# Patient Record
Sex: Female | Born: 1945 | Race: White | Hispanic: No | Marital: Married | Smoking: Current every day smoker
Health system: Southern US, Community
[De-identification: ages and names within clinical notes are randomized; demographics above are authoritative.]

## PROBLEM LIST (undated history)

## (undated) DIAGNOSIS — F419 Anxiety disorder, unspecified: Secondary | ICD-10-CM

## (undated) DIAGNOSIS — I10 Essential (primary) hypertension: Secondary | ICD-10-CM

## (undated) DIAGNOSIS — N200 Calculus of kidney: Secondary | ICD-10-CM

---

## 2000-01-09 ENCOUNTER — Emergency Department (HOSPITAL_COMMUNITY): Admission: EM | Admit: 2000-01-09 | Discharge: 2000-01-09 | Payer: Self-pay | Admitting: Emergency Medicine

## 2000-01-09 ENCOUNTER — Encounter: Payer: Self-pay | Admitting: Emergency Medicine

## 2000-01-25 ENCOUNTER — Ambulatory Visit (HOSPITAL_COMMUNITY): Admission: RE | Admit: 2000-01-25 | Discharge: 2000-01-25 | Payer: Self-pay | Admitting: Urology

## 2000-01-25 ENCOUNTER — Encounter: Payer: Self-pay | Admitting: Urology

## 2012-06-30 ENCOUNTER — Emergency Department (HOSPITAL_COMMUNITY): Payer: Medicare Other

## 2012-06-30 ENCOUNTER — Emergency Department (HOSPITAL_COMMUNITY)
Admission: EM | Admit: 2012-06-30 | Discharge: 2012-06-30 | Disposition: A | Discharge status: Home / Self Care | Payer: Medicare Other | Attending: Emergency Medicine | Admitting: Emergency Medicine

## 2012-06-30 ENCOUNTER — Encounter (HOSPITAL_COMMUNITY): Payer: Self-pay | Admitting: Emergency Medicine

## 2012-06-30 DIAGNOSIS — N132 Hydronephrosis with renal and ureteral calculous obstruction: Secondary | ICD-10-CM

## 2012-06-30 DIAGNOSIS — Z87442 Personal history of urinary calculi: Secondary | ICD-10-CM | POA: Insufficient documentation

## 2012-06-30 DIAGNOSIS — N1330 Unspecified hydronephrosis: Secondary | ICD-10-CM | POA: Insufficient documentation

## 2012-06-30 DIAGNOSIS — N201 Calculus of ureter: Secondary | ICD-10-CM | POA: Insufficient documentation

## 2012-06-30 DIAGNOSIS — Z880 Allergy status to penicillin: Secondary | ICD-10-CM | POA: Insufficient documentation

## 2012-06-30 DIAGNOSIS — F17200 Nicotine dependence, unspecified, uncomplicated: Secondary | ICD-10-CM | POA: Insufficient documentation

## 2012-06-30 HISTORY — DX: Calculus of kidney: N20.0

## 2012-06-30 LAB — URINALYSIS, ROUTINE W REFLEX MICROSCOPIC
Bilirubin Urine: NEGATIVE
Glucose, UA: NEGATIVE mg/dL
Ketones, ur: 15 mg/dL — AB
Leukocytes, UA: NEGATIVE
Nitrite: NEGATIVE
Protein, ur: 100 mg/dL — AB
Specific Gravity, Urine: 1.012 (ref 1.005–1.030)
Urobilinogen, UA: 0.2 mg/dL (ref 0.0–1.0)
pH: 7 (ref 5.0–8.0)

## 2012-06-30 LAB — POCT I-STAT, CHEM 8
Calcium, Ion: 1.1 mmol/L — ABNORMAL LOW (ref 1.13–1.30)
Chloride: 103 mEq/L (ref 96–112)
Glucose, Bld: 136 mg/dL — ABNORMAL HIGH (ref 70–99)
HCT: 49 % — ABNORMAL HIGH (ref 36.0–46.0)

## 2012-06-30 MED ORDER — ONDANSETRON HCL 4 MG/2ML IJ SOLN
4.0000 mg | Freq: Once | INTRAMUSCULAR | Status: AC
  Administered 2012-06-30: 4 mg via INTRAVENOUS
  Filled 2012-06-30: qty 2

## 2012-06-30 MED ORDER — CEPHALEXIN 250 MG PO CAPS: 250.0000 mg | ORAL_CAPSULE | Freq: Four times a day (QID) | ORAL | Status: AC

## 2012-06-30 MED ORDER — HYDROMORPHONE HCL PF 1 MG/ML IJ SOLN
1.0000 mg | Freq: Once | INTRAMUSCULAR | Status: AC
  Administered 2012-06-30: 1 mg via INTRAVENOUS
  Filled 2012-06-30: qty 1

## 2012-06-30 MED ORDER — SODIUM CHLORIDE 0.9 % IV BOLUS (SEPSIS)
500.0000 mL | Freq: Once | INTRAVENOUS | Status: AC
  Administered 2012-06-30: 500 mL via INTRAVENOUS

## 2012-06-30 MED ORDER — CEPHALEXIN 250 MG PO CAPS
500.0000 mg | ORAL_CAPSULE | Freq: Once | ORAL | Status: AC
  Administered 2012-06-30: 500 mg via ORAL
  Filled 2012-06-30: qty 2

## 2012-06-30 MED ORDER — ONDANSETRON 8 MG PO TBDP: 8.0000 mg | ORAL_TABLET | Freq: Three times a day (TID) | ORAL | Status: AC | PRN

## 2012-06-30 MED ORDER — OXYCODONE-ACETAMINOPHEN 5-325 MG PO TABS: 1.0000 | ORAL_TABLET | ORAL | Status: AC | PRN

## 2012-06-30 MED ORDER — LORAZEPAM 1 MG PO TABS
0.5000 mg | ORAL_TABLET | Freq: Once | ORAL | Status: AC
  Administered 2012-06-30: 0.5 mg via ORAL
  Filled 2012-06-30: qty 1

## 2012-06-30 NOTE — ED Notes (Signed)
Pt c/o left sided flank pain starting today; pt sts hx of kidney stone and feels same; pt sts took 4mg  PO dilaudid

## 2012-06-30 NOTE — ED Notes (Signed)
Pt discharged.Vital signs stable and GCS 15 

## 2012-06-30 NOTE — ED Provider Notes (Signed)
History     CSN: [id number]  Arrival date & time 06/30/12  1148   First MD Initiated Contact with Patient 06/30/12 1159      Chief Complaint  Patient presents with  . Flank Pain    (Consider location/radiation/quality/duration/timing/severity/associated sxs/prior treatment) HPI  67 year old female with left flank pain similar to previous kidney stone. Patient began having pain approximately 4 hours prior to my evaluation. It is in the left flank area it area radiating to the left groin. She has not noted hematuria. She had one prior kidney stone in 2002 and did require intervention at that time. She has had nausea but has not vomited. Denies any trauma, urinary frequency, fever, chills, weakness, or lightheadedness.  Past Medical History  Diagnosis Date  . Kidney stone     History reviewed. No pertinent past surgical history.  History reviewed. No pertinent family history.  History  Substance Use Topics  . Smoking status: Current Every Day Smoker  . Smokeless tobacco: Not on file  . Alcohol Use: No    OB History   Grav Para Term Preterm Abortions TAB SAB Ect Mult Living                  Review of Systems  All other systems reviewed and are negative.    Allergies  Codeine; Penicillins; Sulfa antibiotics; and Transderm-scop  Home Medications  No current outpatient prescriptions on file.  BP 155/73  Pulse 74  Temp(Src) 97.9 F (36.6 C)  Resp 16  SpO2 92%  Physical Exam  Nursing note and vitals reviewed. Constitutional: She is oriented to person, place, and time. She appears well-developed and well-nourished.  HENT:  Head: Normocephalic and atraumatic.  Right Ear: External ear normal.  Left Ear: External ear normal.  Nose: Nose normal.  Mouth/Throat: Oropharynx is clear and moist.  Eyes: Conjunctivae and EOM are normal. Pupils are equal, round, and reactive to light.  Neck: Normal range of motion. Neck supple.  Cardiovascular: Normal rate, regular  rhythm, normal heart sounds and intact distal pulses.   Pulmonary/Chest: Effort normal and breath sounds normal.  Abdominal: Soft. Bowel sounds are normal.  Musculoskeletal: Normal range of motion.  Neurological: She is alert and oriented to person, place, and time. She has normal reflexes.  Skin: Skin is warm and dry.  Psychiatric: She has a normal mood and affect. Her behavior is normal. Thought content normal.    ED Course  Procedures (including critical care time)  Labs Reviewed  URINALYSIS, ROUTINE W REFLEX MICROSCOPIC - Abnormal; Notable for the following:    Hgb urine dipstick MODERATE (*)    Ketones, ur 15 (*)    Protein, ur 100 (*)    All other components within normal limits  POCT I-STAT, CHEM 8 - Abnormal; Notable for the following:    Creatinine, Ser 1.30 (*)    Glucose, Bld 136 (*)    Calcium, Ion 1.10 (*)    Hemoglobin 16.7 (*)    HCT 49.0 (*)    All other components within normal limits  URINE MICROSCOPIC-ADD ON   Ct Abdomen Pelvis Wo Contrast  06/30/2012   *RADIOLOGY REPORT*  Clinical Data: Left flank pain.  History of renal calculi.  CT ABDOMEN AND PELVIS WITHOUT CONTRAST  Technique:  Multidetector CT imaging of the abdomen and pelvis was performed following the standard protocol without intravenous contrast.  Comparison: None.  Findings: There is scarring at the lung bases.  There is a calcification in the region of the  right diaphragm.  No active cardiopulmonary process is evident.  The liver shows mild fatty change.  No calcified gallstones.  The spleen is normal.  The pancreas is normal.  The adrenal glands are normal.  The aorta and its branch vessels show atherosclerotic disease but no aneurysm.  The right kidney contains a 3 mm nonobstructing stone in its midportion. There is a 9 mm cyst in the lateral midportion.  The left kidney contains a 3 mm nonobstructing stone in the upper pole and shows a 7 mm stone at the UPJ which is causing moderate left hydronephrosis.   No stones seen distal to that.  Bladder, uterus and adnexal regions are unremarkable.  Mild degenerative changes in the lower lumbar spine without acute bony finding.  IMPRESSION: 7 mm stone at the left UPJ with moderate left hydronephrosis.  3 mm nonobstructing stone in the upper pole of the left kidney and 3 mm nonobstructing stone in the midportion of the right kidney.   Original Report Authenticated By: Paulina Fusi, M.D.     No diagnosis found.    Patient pain free here after 1 mg of Dilaudid. CT results reviewed as above and 7 mm stone noted at left UPJ with moderate left hydronephrosis. Plan consult urology for close outpatient followup.        Hilario Quarry, MD 06/30/12 [phone]

## 2013-11-06 IMAGING — CT CT ABD-PELV W/O CM
2 of 4 series · 14 of 32 positions shown, 19 images · non-contrast
Comparison: None.

CLINICAL DATA: Left flank pain.  History of renal calculi.

CT ABDOMEN AND PELVIS WITHOUT CONTRAST
TECHNIQUE: Multidetector CT imaging of the abdomen and pelvis was
performed following the standard protocol without intravenous
contrast.

[Series 2: renal stone · axial · 0.66mm/px · z∈[-350,-80]mm · 6 of 76 slices shown, 11 images]
[im 11/76  soft-tissue]
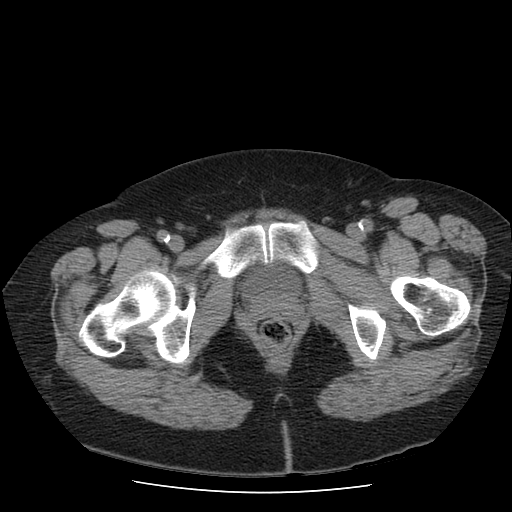
[im 11/76  bone]
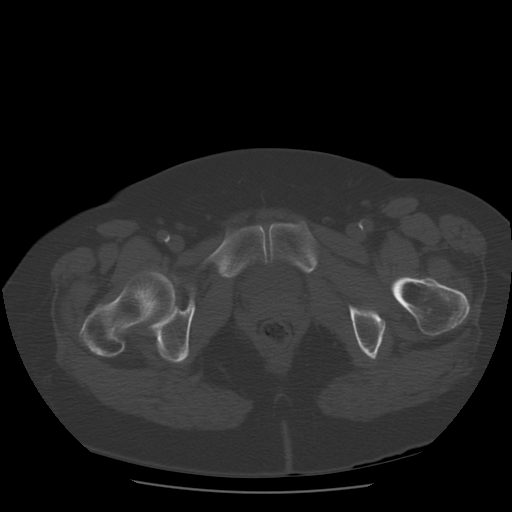
[im 22/76  soft-tissue]
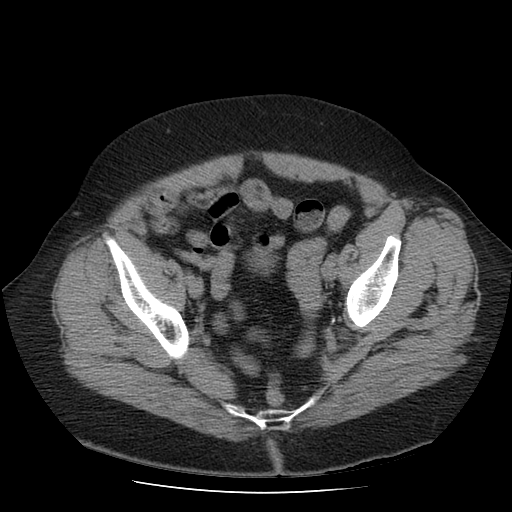
[im 33/76  soft-tissue]
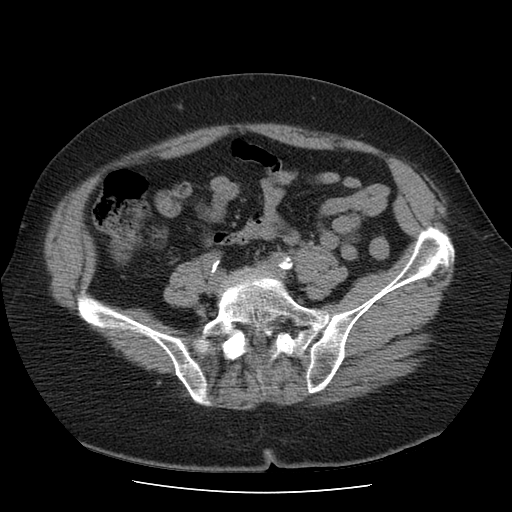
[im 33/76  lung]
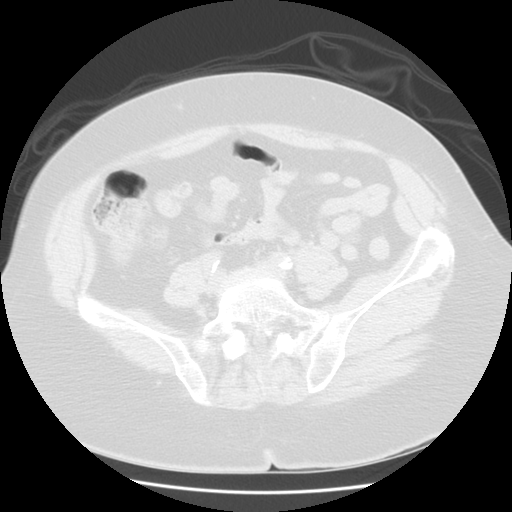
[im 43/76  soft-tissue]
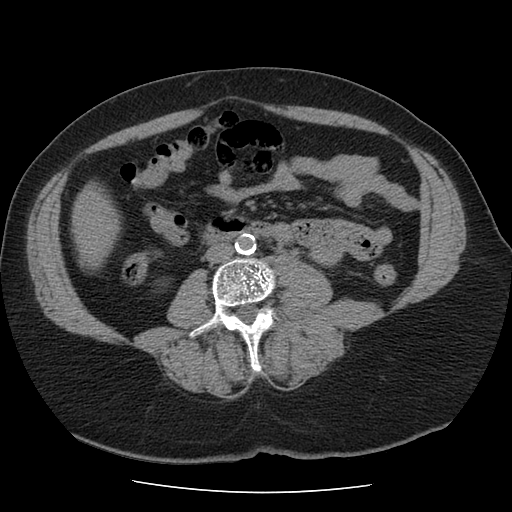
[im 43/76  lung]
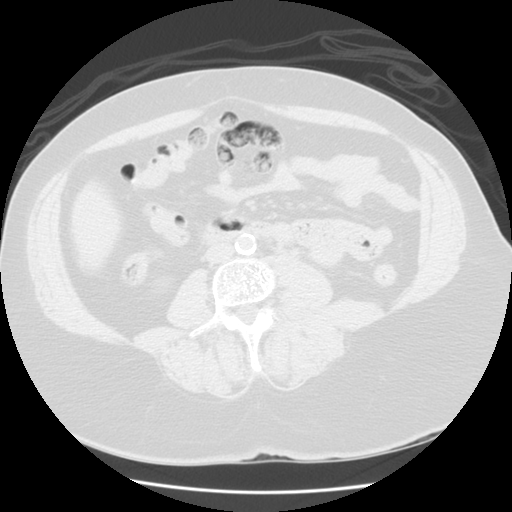
[im 54/76  soft-tissue]
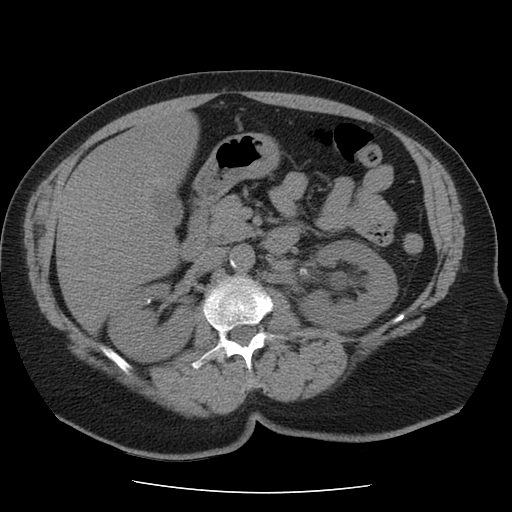
[im 54/76  lung]
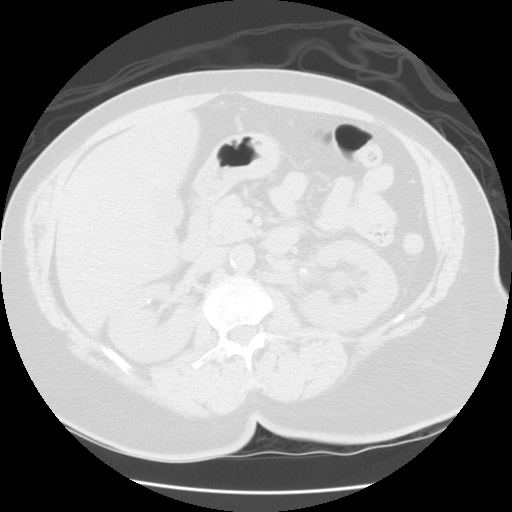
[im 65/76  soft-tissue]
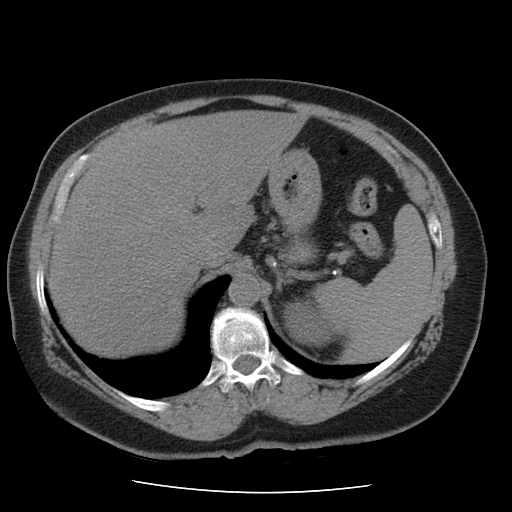
[im 65/76  lung]
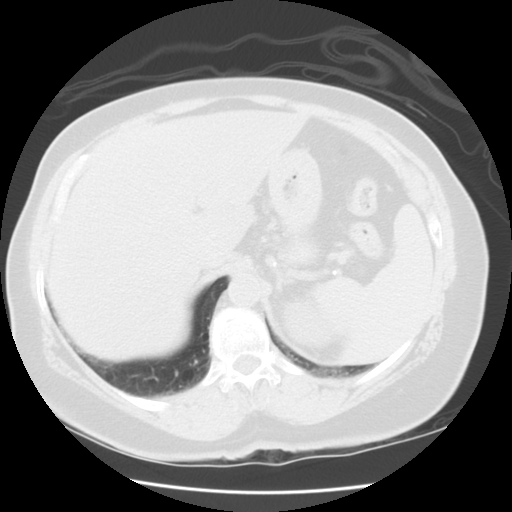

[Series 401: sag · sagittal · 0.75mm/px · 8 of 108 slices shown]
[im 10/108  soft-tissue]
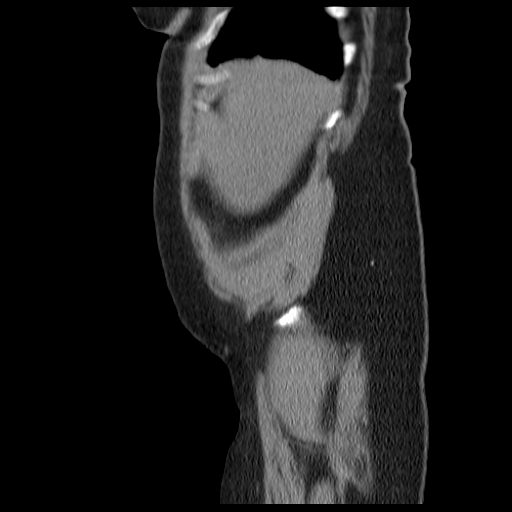
[im 20/108  soft-tissue]
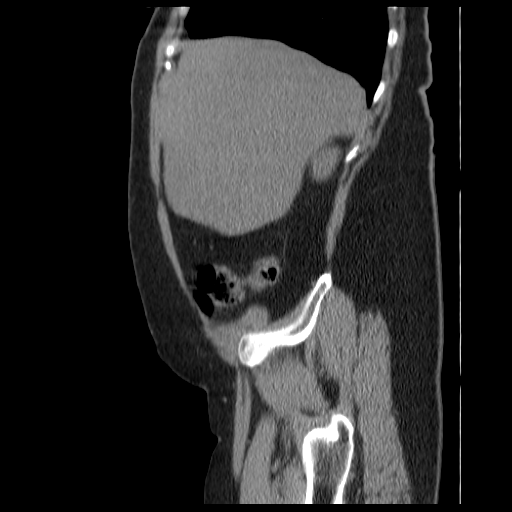
[im 39/108  soft-tissue]
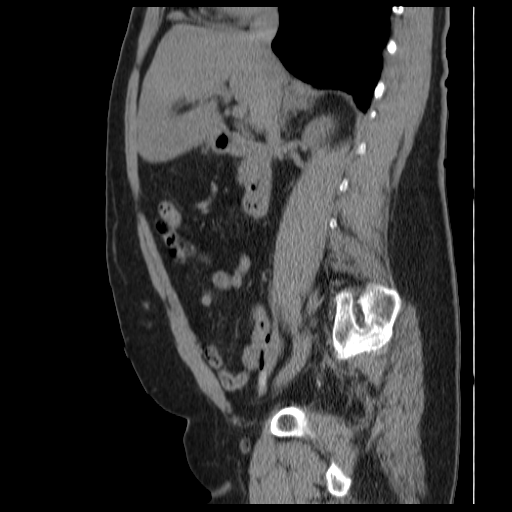
[im 49/108  soft-tissue]
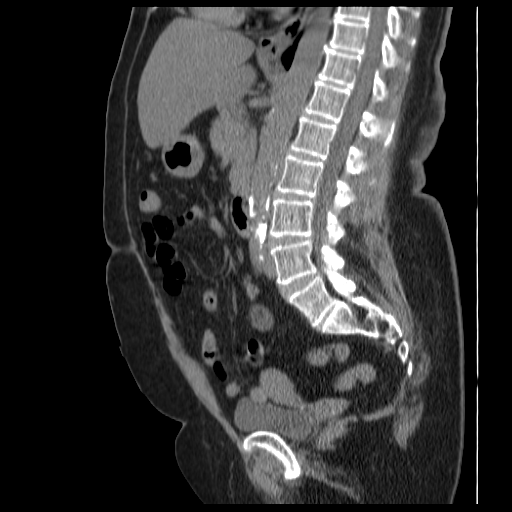
[im 59/108  soft-tissue]
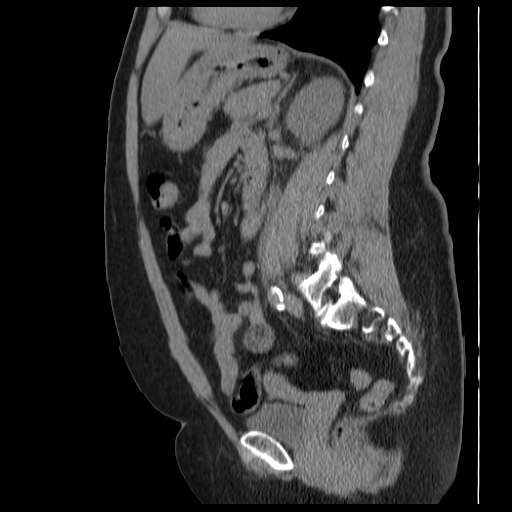
[im 69/108  soft-tissue]
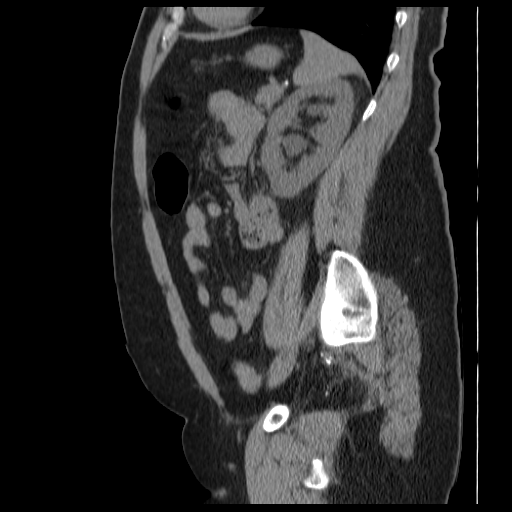
[im 88/108  soft-tissue]
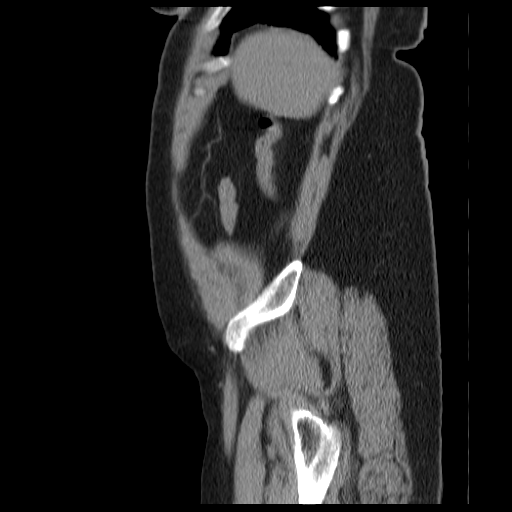
[im 98/108  soft-tissue]
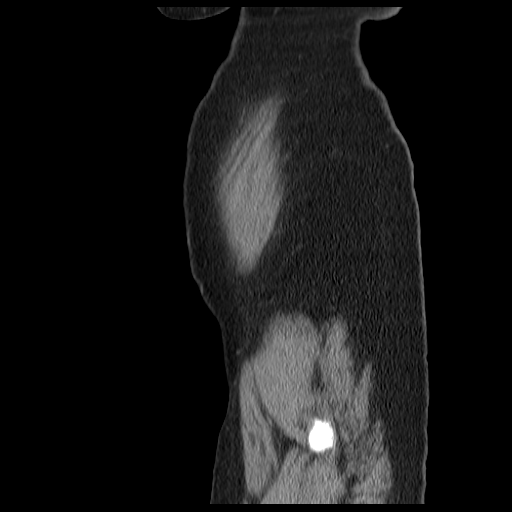

[14 of 32 positions shown; findings below may reference images not displayed]

FINDINGS: There is scarring at the lung bases.  There is a
calcification in the region of the right diaphragm.  No active
cardiopulmonary process is evident.  The liver shows mild fatty
change.  No calcified gallstones.  The spleen is normal.  The
pancreas is normal.  The adrenal glands are normal.  The aorta and
its branch vessels show atherosclerotic disease but no aneurysm.

The right kidney contains a 3 mm nonobstructing stone in its
midportion. There is a 9 mm cyst in the lateral midportion.  The
left kidney contains a 3 mm nonobstructing stone in the upper pole
and shows a 7 mm stone at the UPJ which is causing moderate left
hydronephrosis.  No stones seen distal to that.

Bladder, uterus and adnexal regions are unremarkable.

Mild degenerative changes in the lower lumbar spine without acute
bony finding.
IMPRESSION: 7 mm stone at the left UPJ with moderate left hydronephrosis.

3 mm nonobstructing stone in the upper pole of the left kidney and
3 mm nonobstructing stone in the midportion of the right kidney.

## 2018-07-18 ENCOUNTER — Other Ambulatory Visit: Payer: Self-pay

## 2018-07-18 ENCOUNTER — Encounter (HOSPITAL_COMMUNITY): Payer: Self-pay | Admitting: Emergency Medicine

## 2018-07-18 ENCOUNTER — Emergency Department (HOSPITAL_COMMUNITY): Payer: Medicare Other

## 2018-07-18 ENCOUNTER — Emergency Department (HOSPITAL_COMMUNITY)
Admission: EM | Admit: 2018-07-18 | Discharge: 2018-07-18 | Disposition: A | Discharge status: Home / Self Care | Payer: Medicare Other | Attending: Emergency Medicine | Admitting: Emergency Medicine

## 2018-07-18 DIAGNOSIS — F17210 Nicotine dependence, cigarettes, uncomplicated: Secondary | ICD-10-CM | POA: Diagnosis not present

## 2018-07-18 DIAGNOSIS — K5903 Drug induced constipation: Secondary | ICD-10-CM

## 2018-07-18 DIAGNOSIS — K5641 Fecal impaction: Secondary | ICD-10-CM

## 2018-07-18 DIAGNOSIS — I10 Essential (primary) hypertension: Secondary | ICD-10-CM | POA: Diagnosis not present

## 2018-07-18 DIAGNOSIS — K5900 Constipation, unspecified: Secondary | ICD-10-CM | POA: Diagnosis present

## 2018-07-18 HISTORY — DX: Essential (primary) hypertension: I10

## 2018-07-18 HISTORY — DX: Anxiety disorder, unspecified: F41.9

## 2018-07-18 LAB — CBC WITH DIFFERENTIAL/PLATELET
Abs Immature Granulocytes: 0.04 10*3/uL (ref 0.00–0.07)
Basophils Absolute: 0 10*3/uL (ref 0.0–0.1)
Basophils Relative: 0 %
Eosinophils Absolute: 0 10*3/uL (ref 0.0–0.5)
Eosinophils Relative: 0 %
HCT: 49.3 % — ABNORMAL HIGH (ref 36.0–46.0)
Hemoglobin: 17.3 g/dL — ABNORMAL HIGH (ref 12.0–15.0)
Immature Granulocytes: 0 %
Lymphocytes Relative: 9 %
Lymphs Abs: 1 10*3/uL (ref 0.7–4.0)
MCH: 35.5 pg — ABNORMAL HIGH (ref 26.0–34.0)
MCHC: 35.1 g/dL (ref 30.0–36.0)
MCV: 101.2 fL — ABNORMAL HIGH (ref 80.0–100.0)
Monocytes Absolute: 0.4 10*3/uL (ref 0.1–1.0)
Monocytes Relative: 4 %
Neutro Abs: 9.5 10*3/uL — ABNORMAL HIGH (ref 1.7–7.7)
Neutrophils Relative %: 87 %
Platelets: 283 10*3/uL (ref 150–400)
RBC: 4.87 MIL/uL (ref 3.87–5.11)
RDW: 11.1 % — ABNORMAL LOW (ref 11.5–15.5)
WBC: 10.9 10*3/uL — ABNORMAL HIGH (ref 4.0–10.5)
nRBC: 0 % (ref 0.0–0.2)

## 2018-07-18 LAB — URINALYSIS, ROUTINE W REFLEX MICROSCOPIC
Bilirubin Urine: NEGATIVE
Glucose, UA: NEGATIVE mg/dL
Ketones, ur: NEGATIVE mg/dL
Leukocytes,Ua: NEGATIVE
Nitrite: NEGATIVE
Protein, ur: 30 mg/dL — AB
Specific Gravity, Urine: 1.009 (ref 1.005–1.030)
pH: 5 (ref 5.0–8.0)

## 2018-07-18 LAB — COMPREHENSIVE METABOLIC PANEL
ALT: 24 U/L (ref 0–44)
AST: 20 U/L (ref 15–41)
Albumin: 4.4 g/dL (ref 3.5–5.0)
Alkaline Phosphatase: 64 U/L (ref 38–126)
Anion gap: 12 (ref 5–15)
BUN: 14 mg/dL (ref 8–23)
CO2: 22 mmol/L (ref 22–32)
Calcium: 9.4 mg/dL (ref 8.9–10.3)
Chloride: 98 mmol/L (ref 98–111)
Creatinine, Ser: 0.77 mg/dL (ref 0.44–1.00)
GFR calc Af Amer: >60 mL/min (ref >60)
GFR calc non Af Amer: >60 mL/min (ref >60)
Glucose, Bld: 122 mg/dL — ABNORMAL HIGH (ref 70–99)
Potassium: 3.8 mmol/L (ref 3.5–5.1)
Sodium: 132 mmol/L — ABNORMAL LOW (ref 135–145)
Total Bilirubin: 1.1 mg/dL (ref 0.3–1.2)
Total Protein: 7.2 g/dL (ref 6.5–8.1)

## 2018-07-18 LAB — LIPASE, BLOOD: Lipase: 25 U/L (ref 11–51)

## 2018-07-18 MED ORDER — MILK AND MOLASSES ENEMA
1.0000 | Freq: Once | RECTAL | Status: AC
  Administered 2018-07-18: 240 mL via RECTAL
  Filled 2018-07-18: qty 240

## 2018-07-18 NOTE — Discharge Instructions (Addendum)
You may take MiraLAX 1 capful daily for the next 5 days.  Please avoid any narcotics.  You experience abdominal pain, worsening symptoms you may return to the emergency department.

## 2018-07-18 NOTE — ED Notes (Signed)
Pt given ice water per PA

## 2018-07-18 NOTE — ED Provider Notes (Signed)
[facility] DEPART[facility] Provider Note   CSN: [id number] Arrival date & time: 07/18/18  1532    History   Chief Complaint Chief Complaint  Patient presents with  . Constipation    HPI Kelsey Melton is a 73 y.o. female.     73 y.o female with a  PMH of Anxiety, HTN presents to the ED with a chief complaint of constipation x 12 days. Patient reports she suffered from what she believes was sciatica about one week ago. She self medicated by taking her husband hydromorphone 2 mg at night for 1 week. States she has not been able to have a bowel movement since. She reports her last bowel movement was 07/06/2018, 12 days ago. She has tried stool softener such as Colace without improvement.  She reports taking half a bottle of magnesium citrate yesterday, has not been able to have a bowel movement.  She reports no abdominal pain however states there is abdominal distention.  Patient reports she strained at home, reports she felt hemorrhoids around the area.  Denies any rectal bleeding, fevers, prior history of cancer, urinary symptoms.  The history is provided by the patient, medical records and the spouse.  Constipation Associated symptoms: abdominal pain and back pain   Associated symptoms: no dysuria, no fever, no nausea and no vomiting     Past Medical History:  Diagnosis Date  . Anxiety   . Hypertension   . Kidney stone     There are no active problems to display for this patient.   Past Surgical History:  Procedure Laterality Date  . CESAREAN SECTION       OB History   No obstetric history on file.      Home Medications    Prior to Admission medications   Medication Sig Start Date End Date Taking? Authorizing Provider  acetaminophen (TYLENOL) 500 MG tablet Take 500 mg by mouth every 6 (six) hours as needed for pain.    [provider]  calcium carbonate (TUMS EX) 750 MG chewable tablet Chew 0.5 tablets by mouth as needed for  heartburn.    [provider]  cephALEXin (KEFLEX) 250 MG capsule Take 1 capsule (250 mg total) by mouth 4 (four) times daily. 06/30/12   Pattricia Boss, MD  Multiple Vitamin (MULTIVITAMIN WITH MINERALS) TABS Take 1 tablet by mouth daily.    [provider]  ondansetron (ZOFRAN ODT) 8 MG disintegrating tablet Take 1 tablet (8 mg total) by mouth every 8 (eight) hours as needed for nausea. 06/30/12   Pattricia Boss, MD  oxyCODONE-acetaminophen (PERCOCET/ROXICET) 5-325 MG per tablet Take 1 tablet by mouth every 4 (four) hours as needed for pain. 06/30/12   Pattricia Boss, MD    Family History No family history on file.  Social History Social History   Tobacco Use  . Smoking status: Current Every Day Smoker  . Smokeless tobacco: Never Used  Substance Use Topics  . Alcohol use: No  . Drug use: No     Allergies   Codeine, Penicillins, Sulfa antibiotics, and Transderm-scop [scopolamine]   Review of Systems Review of Systems  Constitutional: Negative for chills and fever.  HENT: Negative for ear pain and sore throat.   Eyes: Negative for pain and visual disturbance.  Respiratory: Negative for cough and shortness of breath.   Cardiovascular: Negative for chest pain and palpitations.  Gastrointestinal: Positive for abdominal pain and constipation. Negative for nausea and vomiting.  Genitourinary: Negative for dysuria and hematuria.  Musculoskeletal: Positive for back pain. Negative for arthralgias.  Skin: Negative for color change and rash.  Neurological: Negative for seizures and syncope.  All other systems reviewed and are negative.    Physical Exam Updated Vital Signs BP (!) 148/81   Pulse 87   Temp 98.5 F (36.9 C)   Resp 16   Ht 4' 10.5" (1.486 m)   Wt 60.8 kg   SpO2 95%   BMI 27.53 kg/m   Physical Exam Vitals signs and nursing note reviewed.  Constitutional:      General: She is not in acute distress.    Appearance: She is well-developed.     Comments:  Appears uncomfortable.   HENT:     Head: Normocephalic and atraumatic.     Mouth/Throat:     Pharynx: No oropharyngeal exudate.  Eyes:     Pupils: Pupils are equal, round, and reactive to light.  Neck:     Musculoskeletal: Normal range of motion.  Cardiovascular:     Rate and Rhythm: Regular rhythm.     Heart sounds: Normal heart sounds.  Pulmonary:     Effort: Pulmonary effort is normal. No respiratory distress.     Breath sounds: Normal breath sounds.  Abdominal:     General: Bowel sounds are decreased. There is no distension.     Palpations: Abdomen is soft.     Tenderness: There is no abdominal tenderness. There is no right CVA tenderness or left CVA tenderness.     Comments: Abdomen appears distended with hypoactive bowel sounds.   Musculoskeletal:        General: No tenderness or deformity.     Right lower leg: No edema.     Left lower leg: No edema.  Skin:    General: Skin is warm and dry.  Neurological:     Mental Status: She is alert and oriented to person, place, and time.      ED Treatments / Results  Labs (all labs ordered are listed, but only abnormal results are displayed) Labs Reviewed  CBC WITH DIFFERENTIAL/PLATELET - Abnormal; Notable for the following components:      Result Value   WBC 10.9 (*)    Hemoglobin 17.3 (*)    HCT 49.3 (*)    MCV 101.2 (*)    MCH 35.5 (*)    RDW 11.1 (*)    Neutro Abs 9.5 (*)    All other components within normal limits  COMPREHENSIVE METABOLIC PANEL - Abnormal; Notable for the following components:   Sodium 132 (*)    Glucose, Bld 122 (*)    All other components within normal limits  URINALYSIS, ROUTINE W REFLEX MICROSCOPIC - Abnormal; Notable for the following components:   Hgb urine dipstick MODERATE (*)    Protein, ur 30 (*)    Bacteria, UA RARE (*)    All other components within normal limits  LIPASE, BLOOD    EKG None  Radiology Dg Abdomen 1 View  Result Date: 07/18/2018 CLINICAL DATA:  Constipation  EXAM: ABDOMEN - 1 VIEW COMPARISON:  CT dated June 30, 2012. Abdominal radiograph dated July 08, 2012. FINDINGS: There is a moderate amount of stool throughout the colon. The bowel gas pattern is nonobstructive. There is some mild height loss of the L1 vertebral body which appears stable across prior studies. There are degenerative changes throughout the lumbar spine. No definite calcified nephroliths. IMPRESSION: Moderate amount of stool throughout the colon. Nonobstructive bowel gas pattern. Electronically Signed   By: Cristal Deerhristopher  Green M.D.   On: 07/18/2018 18:58    Procedures Fecal disimpaction  Date/Time: 07/18/2018 9:06 PM Performed by: Claude MangesSoto, Kristyne Woodring, PA-C Authorized by: Claude MangesSoto, Yvette Roark, PA-C  Consent: Verbal consent obtained. Written consent not obtained. Consent given by: patient Patient understanding: patient states understanding of the procedure being performed Patient identity confirmed: verbally with patient and arm band Local anesthesia used: no  Anesthesia: Local anesthesia used: no  Sedation: Patient sedated: no  Patient tolerance: patient tolerated the procedure well with no immediate complications Comments: Stool removed from vault, bleeding noted due to internal hemorrhoids. Milk of molasses given.     (including critical care time)  Medications Ordered in ED Medications  milk and molasses enema (240 mLs Rectal Given 07/18/18 2044)     Initial Impression / Assessment and Plan / ED Course  I have reviewed the triage vital signs and the nursing notes.  Pertinent labs & imaging results that were available during my care of the patient were reviewed by me and considered in my medical decision making (see chart for details).       Patient with a past medical history of hypertension presents to the ED with a chief complaint of constipation x12 days.  Reports her last bowel movement was on 14 June, has been unable to use the bathroom since.  She reports straining several  times while at home, has tried Colace, magnesium citrate without improvement.  Denies any abdominal pain, fevers, IV drug use, previous history of cancer or urinary symptoms. During primary evaluation patient's abdomen appears distended, decreased bowel sounds on auscultation.  No abdominal tenderness.  Patient appears uncomfortable.  Will obtain laboratory results along with imaging to further evaluate.  CBC showed slight increase in white blood cell count, hemoglobin slightly elevated.  CMP showed slight decrease in sodium, no other electrolyte abnormality.  LFTs are within normal limits.  UA showed moderate amount of hemoglobin, some protein present, rare bacteria.  Lipase level is within normal limits.  9:17 PM patient was given a milk of molasses enema by nursing staff with me present in the room, began to have bowel movement in the bed, was placed on a bedside commode.  Patient currently on bedside commode.   Patient is requesting discharge from the emergency department at this time, reports the pressure on her rectum has improved significantly after bowel movements.  Return precautions provided at length.   Portions of this note were generated with Scientist, clinical (histocompatibility and immunogenetics)Dragon dictation software. Dictation errors may occur despite best attempts at proofreading.   Final Clinical Impressions(s) / ED Diagnoses   Final diagnoses:  Drug-induced constipation  Fecal impaction in rectum [facility])    ED Discharge Orders    None       Claude MangesSoto, Korbin Mapps, Cordelia Poche-C 07/18/18 2128    Bethann BerkshireZammit, Joseph, MD 07/22/18 1246

## 2018-07-18 NOTE — ED Notes (Signed)
Spouse Kelsey Melton number for updates.  [phone].

## 2018-07-18 NOTE — ED Triage Notes (Addendum)
Reports feeling constipated since yesterday and now having cramping after drinking 1/2 a bottle of mag citrate.  Last bm reported 2 weeks ago.  Also reports hemorrhoids are irritated causing rectal pain.

## 2018-07-18 NOTE — ED Notes (Signed)
Enema given, pt sitting on bedside commode

## 2018-07-28 ENCOUNTER — Encounter (HOSPITAL_COMMUNITY): Payer: Self-pay

## 2018-07-28 ENCOUNTER — Emergency Department (HOSPITAL_COMMUNITY)
Admission: EM | Admit: 2018-07-28 | Discharge: 2018-07-28 | Disposition: A | Discharge status: Home / Self Care | Payer: Medicare Other | Attending: Emergency Medicine | Admitting: Emergency Medicine

## 2018-07-28 ENCOUNTER — Other Ambulatory Visit: Payer: Self-pay

## 2018-07-28 DIAGNOSIS — F17210 Nicotine dependence, cigarettes, uncomplicated: Secondary | ICD-10-CM | POA: Diagnosis not present

## 2018-07-28 DIAGNOSIS — R197 Diarrhea, unspecified: Secondary | ICD-10-CM | POA: Insufficient documentation

## 2018-07-28 DIAGNOSIS — R110 Nausea: Secondary | ICD-10-CM | POA: Insufficient documentation

## 2018-07-28 DIAGNOSIS — R1013 Epigastric pain: Secondary | ICD-10-CM

## 2018-07-28 DIAGNOSIS — Z79899 Other long term (current) drug therapy: Secondary | ICD-10-CM | POA: Diagnosis not present

## 2018-07-28 LAB — COMPREHENSIVE METABOLIC PANEL
ALT: 21 U/L (ref 0–44)
AST: 18 U/L (ref 15–41)
Albumin: 4.6 g/dL (ref 3.5–5.0)
Alkaline Phosphatase: 69 U/L (ref 38–126)
Anion gap: 15 (ref 5–15)
BUN: 11 mg/dL (ref 8–23)
CO2: 20 mmol/L — ABNORMAL LOW (ref 22–32)
Calcium: 9.9 mg/dL (ref 8.9–10.3)
Chloride: 102 mmol/L (ref 98–111)
Creatinine, Ser: 0.62 mg/dL (ref 0.44–1.00)
GFR calc Af Amer: >60 mL/min (ref >60)
GFR calc non Af Amer: >60 mL/min (ref >60)
Glucose, Bld: 126 mg/dL — ABNORMAL HIGH (ref 70–99)
Potassium: 3.6 mmol/L (ref 3.5–5.1)
Sodium: 137 mmol/L (ref 135–145)
Total Bilirubin: 0.7 mg/dL (ref 0.3–1.2)
Total Protein: 8 g/dL (ref 6.5–8.1)

## 2018-07-28 LAB — URINALYSIS, ROUTINE W REFLEX MICROSCOPIC
Bacteria, UA: NONE SEEN
Bilirubin Urine: NEGATIVE
Glucose, UA: NEGATIVE mg/dL
Ketones, ur: NEGATIVE mg/dL
Leukocytes,Ua: NEGATIVE
Nitrite: NEGATIVE
Protein, ur: NEGATIVE mg/dL
Specific Gravity, Urine: 1.006 (ref 1.005–1.030)
pH: 5 (ref 5.0–8.0)

## 2018-07-28 LAB — CBC
HCT: 50.6 % — ABNORMAL HIGH (ref 36.0–46.0)
Hemoglobin: 18 g/dL — ABNORMAL HIGH (ref 12.0–15.0)
MCH: 36.6 pg — ABNORMAL HIGH (ref 26.0–34.0)
MCHC: 35.6 g/dL (ref 30.0–36.0)
MCV: 102.8 fL — ABNORMAL HIGH (ref 80.0–100.0)
Platelets: 283 10*3/uL (ref 150–400)
RBC: 4.92 MIL/uL (ref 3.87–5.11)
RDW: 11.2 % — ABNORMAL LOW (ref 11.5–15.5)
WBC: 11.3 10*3/uL — ABNORMAL HIGH (ref 4.0–10.5)
nRBC: 0 % (ref 0.0–0.2)

## 2018-07-28 LAB — LIPASE, BLOOD: Lipase: 36 U/L (ref 11–51)

## 2018-07-28 MED ORDER — ONDANSETRON HCL 4 MG/2ML IJ SOLN: 4.0000 mg | INTRAMUSCULAR | Status: DC | PRN

## 2018-07-28 MED ORDER — SODIUM CHLORIDE 0.9% FLUSH: 3.0000 mL | Freq: Once | INTRAVENOUS | Status: DC

## 2018-07-28 MED ORDER — MORPHINE SULFATE (PF) 4 MG/ML IV SOLN
4.0000 mg | Freq: Once | INTRAVENOUS | Status: AC
  Administered 2018-07-28: 4 mg via INTRAVENOUS
  Filled 2018-07-28: qty 1

## 2018-07-28 MED ORDER — OMEPRAZOLE 20 MG PO CPDR: 20.0000 mg | DELAYED_RELEASE_CAPSULE | Freq: Two times a day (BID) | ORAL | 0 refills | Status: AC

## 2018-07-28 MED ORDER — SODIUM CHLORIDE 0.9 % IV SOLN
Freq: Once | INTRAVENOUS | Status: AC
  Administered 2018-07-28: 10:00:00 via INTRAVENOUS

## 2018-07-28 MED ORDER — ALUM & MAG HYDROXIDE-SIMETH 200-200-20 MG/5ML PO SUSP
15.0000 mL | Freq: Once | ORAL | Status: AC
  Administered 2018-07-28: 15 mL via ORAL
  Filled 2018-07-28: qty 30

## 2018-07-28 NOTE — ED Provider Notes (Signed)
[facility] DEPT Provider Note   CSN: [id number] Arrival date & time: 07/28/18  4098    History   Chief Complaint Chief Complaint  Patient presents with  . Abdominal Pain  . Nausea  . Sciatica    HPI Kelsey Melton is a 73 y.o. female with a PMHx of sciatica, HTN who presents to the ED with c/o epigastric abdominal pain and diarrhea.   Kelsey Melton states that her problems began after developing a recurrent bout of sciatica. She took her husband's hydromorphine for pain control and developed severe constipation. This led to her being disimpacted at [facility] on 6/26. Per instructions, she took Mirlax for 3 days straight approximately 1 week ago.She developed water-y diarrhea with multiple episodes per day. Yesterday she had 3 episodes and just 1 episode today so far. She denies any blood or black in her stool. Due to decreased appetite and diarrhea, she feels generalized weakness.   Yesterday after eating, she began to have sudden onset epigastric abdominal pain that is described as burning. The pain did not radiate any where else. It was exacerbated by eating, but relieved on its own after 2 hrs. She endorses some nausea with pain onset, but denies vomiting. She denies eating any spicy or acidic food lately. She has been taking Tylenol every 4hrs for the past 2 weeks, usually on an empty stomach. Endorses history of acid reflux, but denies ever having a pain similar to this before.    The history is provided by the patient.  Abdominal Pain Associated symptoms: cough ("Smoker's cough"), diarrhea and nausea   Associated symptoms: no chest pain, no chills, no constipation, no fever, no shortness of breath, no sore throat and no vomiting     Past Medical History:  Diagnosis Date  . Anxiety   . Hypertension   . Kidney stone     There are no active problems to display for this patient.   Past Surgical History:  Procedure Laterality Date  .  CESAREAN SECTION       OB History   No obstetric history on file.      Home Medications    Prior to Admission medications   Medication Sig Start Date End Date Taking? Authorizing Provider  acetaminophen (TYLENOL) 500 MG tablet Take 500 mg by mouth every 6 (six) hours as needed for pain.   Yes [provider]  atorvastatin (LIPITOR) 10 MG tablet Take 10 mg by mouth daily. 06/18/18  Yes [provider]  clonazePAM (KLONOPIN) 0.5 MG tablet Take 0.125 mg by mouth 2 (two) times a day.  04/14/18  Yes [provider]  valsartan (DIOVAN) 160 MG tablet Take 320 mg by mouth daily.  05/12/18  Yes [provider]  cephALEXin (KEFLEX) 250 MG capsule Take 1 capsule (250 mg total) by mouth 4 (four) times daily. Patient not taking: Reported on 07/28/2018 06/30/12   Pattricia Boss, MD  omeprazole (PRILOSEC) 20 MG capsule Take 1 capsule (20 mg total) by mouth 2 (two) times daily before a meal. 07/28/18 07/28/19  Jose Persia, MD  ondansetron (ZOFRAN ODT) 8 MG disintegrating tablet Take 1 tablet (8 mg total) by mouth every 8 (eight) hours as needed for nausea. Patient not taking: Reported on 07/28/2018 06/30/12   Pattricia Boss, MD  oxyCODONE-acetaminophen (PERCOCET/ROXICET) 5-325 MG per tablet Take 1 tablet by mouth every 4 (four) hours as needed for pain. Patient not taking: Reported on 07/28/2018 06/30/12   Pattricia Boss, MD  Family History Family History  Problem Relation Age of Onset  . Cancer Mother   . Stroke Father     Social History Social History   Tobacco Use  . Smoking status: Current Every Day Smoker    Packs/day: 1.00    Types: Cigarettes  . Smokeless tobacco: Never Used  Substance Use Topics  . Alcohol use: No  . Drug use: No     Allergies   Codeine, Penicillins, Sulfa antibiotics, and Transderm-scop [scopolamine]   Review of Systems Review of Systems  Constitutional: Positive for appetite change (decreased appetite). Negative for chills and fever.   HENT: Negative for rhinorrhea and sore throat.   Respiratory: Positive for cough ("Smoker's cough"). Negative for shortness of breath.   Cardiovascular: Negative for chest pain, palpitations and leg swelling.  Gastrointestinal: Positive for abdominal pain (epigastric), diarrhea and nausea. Negative for abdominal distention, anal bleeding, blood in stool, constipation and vomiting.  Genitourinary: Negative for difficulty urinating.  Musculoskeletal: Positive for back pain (Right sided sciatica). Negative for neck pain.  Skin: Negative for rash and wound.  Neurological: Positive for weakness (Generalized) and headaches (Dull, mild). Negative for dizziness.  All other systems reviewed and are negative.    Physical Exam Updated Vital Signs BP (!) 170/94 (BP Location: Right Arm)   Pulse 84   Temp 98.1 F (36.7 C) (Oral)   Resp 16   Ht 4\' 10"  (1.473 m)   Wt 60.8 kg   SpO2 95%   BMI 28.01 kg/m   Physical Exam Vitals signs and nursing note reviewed.  Constitutional:      Appearance: She is well-developed.  HENT:     Head: Normocephalic and atraumatic.  Cardiovascular:     Rate and Rhythm: Normal rate and regular rhythm.     Heart sounds: No murmur.  Pulmonary:     Effort: Pulmonary effort is normal. No respiratory distress.     Breath sounds: Normal breath sounds. No wheezing.  Abdominal:     General: Bowel sounds are normal. There is no distension.     Palpations: Abdomen is soft. There is no mass.     Tenderness: There is no abdominal tenderness. There is no guarding.  Skin:    General: Skin is warm and dry.  Neurological:     General: No focal deficit present.     Mental Status: She is alert and oriented to person, place, and time.  Psychiatric:        Mood and Affect: Mood is anxious (Teary on exam).      ED Treatments / Results  Labs (all labs ordered are listed, but only abnormal results are displayed) Labs Reviewed  COMPREHENSIVE METABOLIC PANEL - Abnormal;  Notable for the following components:      Result Value   CO2 20 (*)    Glucose, Bld 126 (*)    All other components within normal limits  CBC - Abnormal; Notable for the following components:   WBC 11.3 (*)    Hemoglobin 18.0 (*)    HCT 50.6 (*)    MCV 102.8 (*)    MCH 36.6 (*)    RDW 11.2 (*)    All other components within normal limits  URINALYSIS, ROUTINE W REFLEX MICROSCOPIC - Abnormal; Notable for the following components:   Color, Urine STRAW (*)    Hgb urine dipstick MODERATE (*)    All other components within normal limits  LIPASE, BLOOD    EKG None  Radiology No results found.  Procedures Procedures (including critical care time)  Medications Ordered in ED Medications  sodium chloride flush (NS) 0.9 % injection 3 mL (has no administration in time range)  ondansetron (ZOFRAN) injection 4 mg (has no administration in time range)  0.9 %  sodium chloride infusion ( Intravenous Stopped 07/28/18 1300)  morphine 4 MG/ML injection 4 mg (4 mg Intravenous Given 07/28/18 1012)  alum & mag hydroxide-simeth (MAALOX/MYLANTA) 200-200-20 MG/5ML suspension 15 mL (15 mLs Oral Given 07/28/18 1009)     Initial Impression / Assessment and Plan / ED Course  I have reviewed the triage vital signs and the nursing notes.  Pertinent labs & imaging results that were available during my care of the patient were reviewed by me and considered in my medical decision making (see chart for details).  Pandora LeiterLinda Schobert is a 73 y/o female who presents who acute epigastric abdominal pain and diarrhea. Patient's epigastric abdominal pain only occurs immediately after eating and is alleviated after 2 hours or so. She has been taking Tylenol on an empty stomach regularly everyday for over two weeks and she is a smoker, both of these likely contributing to her development of gastritis. LFTs and Lipase was negative, decreasing the likelihood of acute cholecystitis or pancreatitis. Maalox was given and patient  notes it helped her pain completely. Hemoglobin was elevated, ruling out GI bleed.   Diarrhea seems to be secondary to laxative use. Patient developed opioid-use constipation and [facility]. After this, she was instructed to take Miralax for for 1 week but could only tolerate 3 days due to severe diarrhea. She has consistently had diarrhea for the past week. CMP was mostly WNL, with the exception of mild metabolic acidosis.She was given IVF.   Morphine was provided for sciatica pain control while admitted.   Patient felt much better after stated interventions and felt ready to go home. She was instructed to follow up with her PCP. Advised to avoid taking medication on an empty stomach, spicy and acidic foods. Discussed taking omeprazole as an outpatient and patient is in agreement.   Final Clinical Impressions(s) / ED Diagnoses   Final diagnoses:  Epigastric abdominal pain  Diarrhea, unspecified type    ED Discharge Orders         Ordered    omeprazole (PRILOSEC) 20 MG capsule  2 times daily before meals     07/28/18 1159           Verdene LennertBasaraba, Kenston Longton, MD 07/28/18 1310    Azalia Bilisampos, Kevin, MD 07/28/18 1556

## 2018-07-28 NOTE — ED Notes (Signed)
EDP at bedside  

## 2018-07-28 NOTE — ED Notes (Signed)
Pt d/c home per MD order. Discharge summary reviewed, pt verbalizes understanding. Ambulatory no s/s of acute distress. Reports husband is here for ride home.

## 2018-07-28 NOTE — ED Triage Notes (Signed)
Patient reports that she was taking pain meds for sciatica which caused constipation and she went to [facility] ED on 07/18/18. Patient states she was prescribed Miralax and is still having multiple BM's. Patient is now c/o burning sensation of the mid upper abdomen and sciatica on the right side.

## 2018-12-29 ENCOUNTER — Other Ambulatory Visit: Payer: Self-pay

## 2018-12-29 DIAGNOSIS — Z20822 Contact with and (suspected) exposure to covid-19: Secondary | ICD-10-CM

## 2018-12-31 LAB — NOVEL CORONAVIRUS, NAA: SARS-CoV-2, NAA: NOT DETECTED

## 2019-03-22 ENCOUNTER — Ambulatory Visit: Payer: Medicare Other | Attending: Internal Medicine

## 2019-03-22 ENCOUNTER — Other Ambulatory Visit: Payer: Self-pay

## 2019-03-22 DIAGNOSIS — Z23 Encounter for immunization: Secondary | ICD-10-CM | POA: Insufficient documentation

## 2019-03-22 NOTE — Progress Notes (Signed)
   Covid-19 Vaccination Clinic  Name:  Kelsey Melton    MRN: [id number] DOB: 05-31-1945  03/22/2019  Kelsey Melton was observed post Covid-19 immunization for 30 minutes based on pre-vaccination screening without incidence. She was provided with Vaccine Information Sheet and instruction to access the V-Safe system.   Kelsey Melton was instructed to call 911 with any severe reactions post vaccine: Marland Kitchen Difficulty breathing  . Swelling of your face and throat  . A fast heartbeat  . A bad rash all over your body  . Dizziness and weakness    Immunizations Administered    Name Date Dose VIS Date Route   Pfizer COVID-19 Vaccine 03/22/2019  9:51 AM 0.3 mL 01/02/2019 Intramuscular   Manufacturer: [employer], [employer]   Lot: [id number]   NDC: [address]-4

## 2019-04-22 ENCOUNTER — Ambulatory Visit: Payer: Medicare Other | Attending: Internal Medicine

## 2019-04-22 DIAGNOSIS — Z23 Encounter for immunization: Secondary | ICD-10-CM

## 2019-04-22 NOTE — Progress Notes (Signed)
   Covid-19 Vaccination Clinic  Name:  Kelsey Melton    MRN: [id number] DOB: 08-Oct-1945  04/22/2019  [name] was observed post Covid-19 immunization for 15 minutes without incident. She was provided with Vaccine Information Sheet and instruction to access the V-Safe system.   [name] was instructed to call 911 with any severe reactions post vaccine: Marland Kitchen Difficulty breathing  . Swelling of face and throat  . A fast heartbeat  . A bad rash all over body  . Dizziness and weakness   Immunizations Administered    Name Date Dose VIS Date Route   Pfizer COVID-19 Vaccine 04/22/2019 10:06 AM 0.3 mL 01/02/2019 Intramuscular   Manufacturer: [employer], [employer]   Lot: [id number]   NDC: [address]-5

## 2019-11-24 IMAGING — DX ABDOMEN - 1 VIEW
1 series · 1 of 1 positions shown · non-contrast
Comparison: CT dated June 30, 2012. Abdominal radiograph dated July 08, 2012.

CLINICAL DATA: Constipation

EXAM:
ABDOMEN - 1 VIEW

[t abdomen supine]
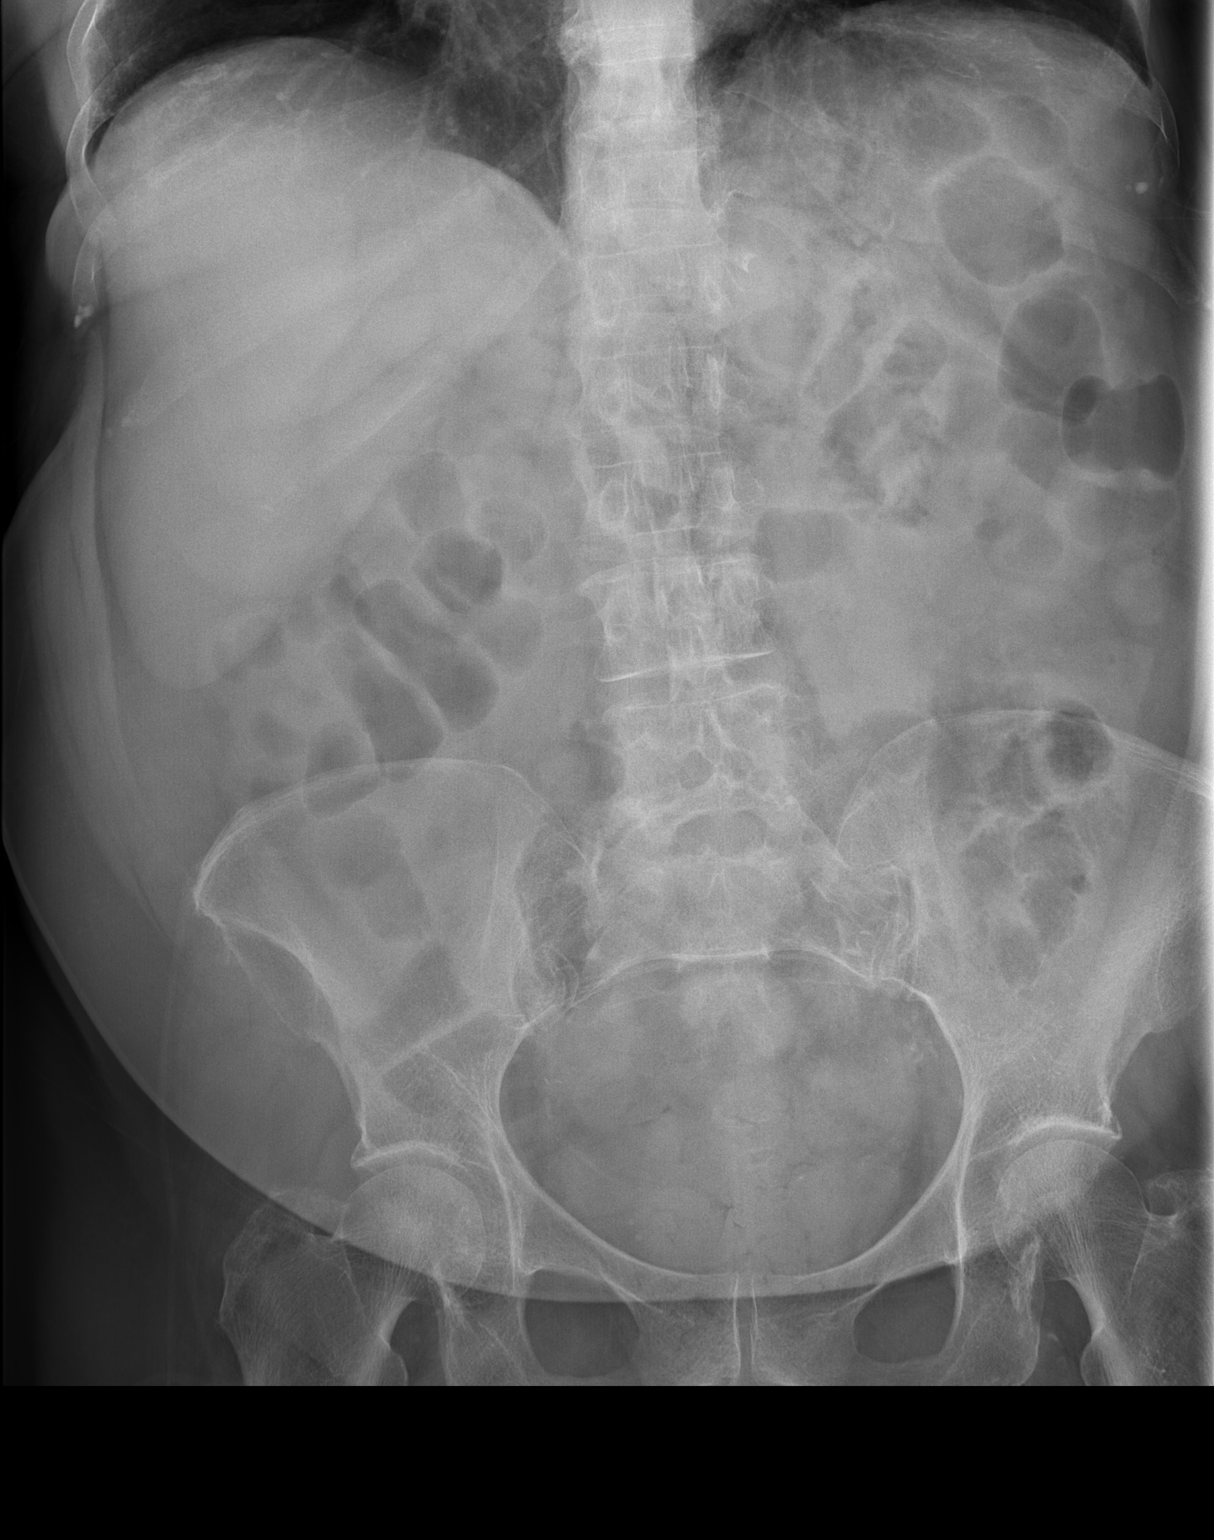

[1 of 1 positions shown; findings below may reference images not displayed]

FINDINGS: There is a moderate amount of stool throughout the colon. The bowel
gas pattern is nonobstructive. There is some mild height loss of the
L1 vertebral body which appears stable across prior studies. There
are degenerative changes throughout the lumbar spine. No definite
calcified nephroliths.
IMPRESSION: Moderate amount of stool throughout the colon.

Nonobstructive bowel gas pattern.
